# Patient Record
Sex: Male | Born: 1980 | Race: Black or African American | Hispanic: No | Marital: Single | Smoking: Former smoker
Health system: Southern US, Community
[De-identification: ages and names within clinical notes are randomized; demographics above are authoritative.]

---

## 2012-01-29 ENCOUNTER — Ambulatory Visit (INDEPENDENT_AMBULATORY_CARE_PROVIDER_SITE_OTHER): Admitting: Family Medicine

## 2012-01-29 VITALS — BP 120/76 | HR 73 | Temp 97.8°F | Resp 16 | Ht 73.0 in | Wt 272.2 lb

## 2012-01-29 DIAGNOSIS — A088 Other specified intestinal infections: Secondary | ICD-10-CM

## 2012-01-29 DIAGNOSIS — A084 Viral intestinal infection, unspecified: Secondary | ICD-10-CM

## 2012-01-29 NOTE — Progress Notes (Signed)
  [facility]:  Office Visit  Chief Complaint:  Chief Complaint  Patient presents with  . Diarrhea    x yesterday  . Abdominal Pain    HPI: Micheal Stephens is a 31 y.o. male who complains of  2 day h/o nonbloody diarrhea, nausea, vomiting x 5 episodes and diarrhea x 7 episodes, however sxs have stopped. Started after eating banana bread, pizza, beer; girlfriend also has similar sxs. Denies fevers, chills. No recent travels.  Episodes back to back after eating, all liquid. He tool Immodium and that seemed to help. Patient would like a not for work.   History reviewed. No pertinent past medical history. History reviewed. No pertinent past surgical history. History   Social History  . Marital Status: Legally Separated    Spouse Name: N/A    Number of Children: N/A  . Years of Education: N/A   Social History Main Topics  . Smoking status: Former Games developer  . Smokeless tobacco: None  . Alcohol Use: Yes  . Drug Use: No  . Sexually Active: None   Other Topics Concern  . None   Social History Narrative  . None   No family history on file. No Known Allergies Prior to Admission medications   Medication Sig Start Date End Date Taking? Authorizing Provider  loperamide (IMODIUM A-D) 2 MG tablet Take 2 mg by mouth 4 (four) times daily as needed.   Yes Historical Provider, MD     ROS: The patient denies fevers, chills, night sweats, unintentional weight loss, chest pain, palpitations, wheezing, dyspnea on exertion,  dysuria, hematuria, melena, numbness, weakness, or tingling. +nausea, vomiting, abdominal pain, nonbloody diarrhea  All other systems have been reviewed and were otherwise negative with the exception of those mentioned in the HPI and as above.    PHYSICAL EXAM: Filed Vitals:   01/29/12 1822  BP: 120/76  Pulse: 73  Temp: 97.8 F (36.6 C)  Resp: 16   Filed Vitals:   01/29/12 1822  Height: 6\' 1"  (1.854 m)  Weight: 272 lb 3.2 oz (123.469 kg)    Body mass index is 35.91 kg/(m^2).  General: Alert, no acute distress HEENT:  Normocephalic, atraumatic, oropharynx patent.  Cardiovascular:  Regular rate and rhythm, no rubs murmurs or gallops.  No Carotid bruits, radial pulse intact. No pedal edema.  Respiratory: Clear to auscultation bilaterally.  No wheezes, rales, or rhonchi.  No cyanosis, no use of accessory musculature GI: No organomegaly, abdomen is soft and non-tender, positive bowel sounds.  No masses. Skin: No rashes. Neurologic: Facial musculature symmetric. Psychiatric: Patient is appropriate throughout our interaction. Lymphatic: No cervical lymphadenopathy Musculoskeletal: Gait intact.   LABS: No results found for this or any previous visit.   EKG/XRAY:   Primary read interpreted by Dr. Conley Rolls at [facility].   ASSESSMENT/PLAN: Encounter Diagnosis  Name Primary?  . Viral gastroenteritis vs Food Posioning Yes    Patient declined any bloodwork and imaging studies. He would like a work note ( paper note written) I have advised him to push fluids, increase diet as tolerated (BRAT diet), and try no to use Immodium; f/u PRN if worsening sxs.       [name], DO 01/29/2012 6:41 PM

## 2015-10-16 ENCOUNTER — Emergency Department (HOSPITAL_COMMUNITY): Payer: Self-pay

## 2015-10-16 ENCOUNTER — Emergency Department (HOSPITAL_COMMUNITY)
Admission: EM | Admit: 2015-10-16 | Discharge: 2015-10-16 | Disposition: A | Discharge status: Home / Self Care | Payer: Self-pay | Attending: Emergency Medicine | Admitting: Emergency Medicine

## 2015-10-16 DIAGNOSIS — S60221A Contusion of right hand, initial encounter: Secondary | ICD-10-CM | POA: Insufficient documentation

## 2015-10-16 DIAGNOSIS — Y9289 Other specified places as the place of occurrence of the external cause: Secondary | ICD-10-CM | POA: Insufficient documentation

## 2015-10-16 DIAGNOSIS — S61216A Laceration without foreign body of right little finger without damage to nail, initial encounter: Secondary | ICD-10-CM | POA: Insufficient documentation

## 2015-10-16 DIAGNOSIS — Z79899 Other long term (current) drug therapy: Secondary | ICD-10-CM | POA: Insufficient documentation

## 2015-10-16 DIAGNOSIS — S61411A Laceration without foreign body of right hand, initial encounter: Secondary | ICD-10-CM

## 2015-10-16 DIAGNOSIS — W228XXA Striking against or struck by other objects, initial encounter: Secondary | ICD-10-CM | POA: Insufficient documentation

## 2015-10-16 DIAGNOSIS — Y998 Other external cause status: Secondary | ICD-10-CM | POA: Insufficient documentation

## 2015-10-16 DIAGNOSIS — Y9389 Activity, other specified: Secondary | ICD-10-CM | POA: Insufficient documentation

## 2015-10-16 DIAGNOSIS — Z87891 Personal history of nicotine dependence: Secondary | ICD-10-CM | POA: Insufficient documentation

## 2015-10-16 MED ORDER — LIDOCAINE HCL (PF) 1 % IJ SOLN
INTRAMUSCULAR | Status: AC
  Administered 2015-10-16: 04:00:00
  Filled 2015-10-16: qty 5

## 2015-10-16 MED ORDER — LIDOCAINE HCL (PF) 1 % IJ SOLN
5.0000 mL | Freq: Once | INTRAMUSCULAR | Status: AC
  Administered 2015-10-16: 5 mL via INTRADERMAL
  Filled 2015-10-16: qty 5

## 2015-10-16 MED ORDER — IBUPROFEN 800 MG PO TABS: 800.0000 mg | ORAL_TABLET | Freq: Three times a day (TID) | ORAL | Status: DC | PRN

## 2015-10-16 MED ORDER — BACITRACIN ZINC 500 UNIT/GM EX OINT
TOPICAL_OINTMENT | CUTANEOUS | Status: AC
  Administered 2015-10-16: 05:00:00
  Filled 2015-10-16: qty 3.6

## 2015-10-16 MED ORDER — OXYCODONE-ACETAMINOPHEN 5-325 MG PO TABS: 1.0000 | ORAL_TABLET | Freq: Four times a day (QID) | ORAL | Status: DC | PRN

## 2015-10-16 MED ORDER — OXYCODONE-ACETAMINOPHEN 5-325 MG PO TABS
2.0000 | ORAL_TABLET | Freq: Once | ORAL | Status: AC
  Administered 2015-10-16: 2 via ORAL
  Filled 2015-10-16: qty 2

## 2015-10-16 MED ORDER — BACITRACIN ZINC 500 UNIT/GM EX OINT
TOPICAL_OINTMENT | Freq: Once | CUTANEOUS | Status: AC
  Administered 2015-10-16: 1 via TOPICAL
  Filled 2015-10-16: qty 0.9

## 2015-10-16 NOTE — ED Provider Notes (Signed)
TIME SEEN: 3:25 AM  CHIEF COMPLAINT: Right hand and wrist injury  HPI: Pt is a 34 y.o. right-hand-dominant male with no significant past medical history he states that he got mad tonight and punched a door with his right hand. Is complaining of pain over the dorsal fourth and fifth metacarpals and a laceration at the base of the right fifth digit with difficulty flexing and extending his finger secondary to pain. Last tetanus vaccination was in 2011. No other injury. No numbness or focal weakness.  ROS: See HPI Constitutional: no fever  Eyes: no drainage  ENT: no runny nose   Cardiovascular:  no chest pain  Resp: no SOB  GI: no vomiting GU: no dysuria Integumentary: no rash  Allergy: no hives  Musculoskeletal: no leg swelling  Neurological: no slurred speech ROS otherwise negative  PAST MEDICAL HISTORY/PAST SURGICAL HISTORY:  No past medical history on file.  MEDICATIONS:  Prior to Admission medications   Medication Sig Start Date End Date Taking? Authorizing Provider  ibuprofen (ADVIL,MOTRIN) 200 MG tablet Take 200 mg by mouth every 6 (six) hours as needed.   Yes Historical Provider, MD  mirtazapine (REMERON) 15 MG tablet Take 10 mg by mouth at bedtime.   Yes Historical Provider, MD  sertraline (ZOLOFT) 100 MG tablet Take 100 mg by mouth daily.   Yes Historical Provider, MD  loperamide (IMODIUM A-D) 2 MG tablet Take 2 mg by mouth 4 (four) times daily as needed.    Historical Provider, MD    ALLERGIES:  No Known Allergies  SOCIAL HISTORY:  Social History  Substance Use Topics  . Smoking status: Former Games developer  . Smokeless tobacco: Not on file  . Alcohol Use: Yes    FAMILY HISTORY: No family history on file.  EXAM: BP 120/80 mmHg  Pulse 91  Temp(Src) 98.1 F (36.7 C) (Oral)  Ht  (1.905 m)  Wt 310 lb (140.615 kg)  BMI 38.75 kg/m2  SpO2 96% RR 22 CONSTITUTIONAL: Alert and oriented and responds appropriately to questions. Well-appearing; well-nourished HEAD:  Normocephalic EYES: Conjunctivae clear, PERRL ENT: normal nose; no rhinorrhea; moist mucous membranes; pharynx without lesions noted NECK: Supple, no meningismus, no LAD  CARD: RRR; S1 and S2 appreciated; no murmurs, no clicks, no rubs, no gallops RESP: Normal chest excursion without splinting or tachypnea; breath sounds clear and equal bilaterally; no wheezes, no rhonchi, no rales, no hypoxia or respiratory distress, speaking full sentences ABD/GI: Normal bowel sounds; non-distended; soft, non-tender, no rebound, no guarding, no peritoneal signs BACK:  The back appears normal and is non-tender to palpation, there is no CVA tenderness EXT: Patient is tender to palpation over the dorsal right wrist diffusely without obvious scaphoid tenderness or pain with axial loading of the thumb, tender over the fourth and fifth metacarpals on the right side without obvious deformity, patient has a difficult time flexing his fingers secondary to pain but there is no obvious scissoring noted, difficult time completely flexing his fingers also secondary to pain, he appears to have a deep 5 cm laceration to the base of the fifth right finger, no tenderness over the right forearm, elbow or humerus or shoulder; 2+ radial pulses bilaterally, reports normal sensation throughout the right arm, otherwise Normal ROM in all joints; otherwise extremity's are non-tender to palpation; no edema; normal capillary refill; no cyanosis, no calf tenderness or swelling    SKIN: Normal color for age and race; warm NEURO: Moves all extremities equally, sensation to light touch intact diffusely, cranial  nerves II through XII intact PSYCH: The patient's mood and manner are appropriate. Grooming and personal hygiene are appropriate.  MEDICAL DECISION MAKING: Patient here with right hand injury. Complaining of right hand and wrist pain. We'll obtain x-rays. His tetanus is up-to-date. Laceration may involve extensor tendons is difficult to get  a good exam on the patient secondary to his pain. Also concerned this could be an open fracture. Will obtain x-ray prior to laceration repair. We'll give pain medication.  ED PROGRESS: X-ray show no fracture or dislocation. I was able to anesthetize the hand with 1% lidocaine without epinephrine and do a thorough inspection. There is no sign of involvement of any tendons. He has full extension and flexion in these fingers. I was able to repair this wound and will place him in a splint for protection given the laceration is right at the base of the fourth and fifth digits. Discussed wound care instructions and return precautions. We'll discharge with pain medication for his contusions. Have advised him to follow up with his primary care provider in 7 days for suture removal. Patient and significant other at bedside verbalize understanding and are comfortable with this plan.    LACERATION REPAIR Performed by: Raelyn NumberWARD, KRISTEN N Authorized by: Raelyn NumberWARD, KRISTEN N Consent: Verbal consent obtained. Risks and benefits: risks, benefits and alternatives were discussed Consent given by: patient Patient identity confirmed: provided demographic data Prepped and Draped in normal sterile fashion Wound explored  Laceration Location: Right hand  Laceration Length: 5 cm  No Foreign Bodies seen or palpated  Anesthesia: local infiltration  Local anesthetic: lidocaine 1 % without epinephrine  Anesthetic total: 6 ml  Irrigation method: syringe Amount of cleaning: standard  Skin closure: Simple   Number of sutures: 8   Technique: Area anesthetized using lidocaine 1% without epinephrine. Wound irrigated copiously with sterile saline. Wound then cleaned with Betadine and draped in sterile fashion. Wound closed using 8 simple interrupted sutures with 3-0 Prolene.  Bacitracin and sterile dressing applied. Good wound approximation and hemostasis achieved.    Patient tolerance: Patient tolerated the procedure  well with no immediate complications.      Layla MawKristen N Ward, DO 10/16/15 [phone]

## 2015-10-16 NOTE — Discharge Instructions (Signed)
Hand Contusion A hand contusion is a deep bruise on your hand area. Contusions are the result of an injury that caused bleeding under the skin. The contusion may turn blue, purple, or yellow. Minor injuries will give you a painless contusion, but more severe contusions may stay painful and swollen for a few weeks. CAUSES  A contusion is usually caused by a blow, trauma, or direct force to an area of the body. SYMPTOMS   Swelling and redness of the injured area.  Discoloration of the injured area.  Tenderness and soreness of the injured area.  Pain. DIAGNOSIS  The diagnosis can be made by taking a history and performing a physical exam. An X-ray, CT scan, or MRI may be needed to determine if there were any associated injuries, such as broken bones (fractures). TREATMENT  Often, the best treatment for a hand contusion is resting, elevating, icing, and applying cold compresses to the injured area. Over-the-counter medicines may also be recommended for pain control. HOME CARE INSTRUCTIONS   Put ice on the injured area.  Put ice in a plastic bag.  Place a towel between your skin and the bag.  Leave the ice on for 15-20 minutes, 03-04 times a day.  Only take over-the-counter or prescription medicines as directed by your caregiver. Your caregiver may recommend avoiding anti-inflammatory medicines (aspirin, ibuprofen, and naproxen) for 48 hours because these medicines may increase bruising.  If told, use an elastic wrap as directed. This can help reduce swelling. You may remove the wrap for sleeping, showering, and bathing. If your fingers become numb, cold, or blue, take the wrap off and reapply it more loosely.  Elevate your hand with pillows to reduce swelling.  Avoid overusing your hand if it is painful. SEEK IMMEDIATE MEDICAL CARE IF:   You have increased redness, swelling, or pain in your hand.  Your swelling or pain is not relieved with medicines.  You have loss of feeling in  your hand or are unable to move your fingers.  Your hand turns cold or blue.  You have pain when you move your fingers.  Your hand becomes warm to the touch.  Your contusion does not improve in 2 days. MAKE SURE YOU:   Understand these instructions.  Will watch your condition.  Will get help right away if you are not doing well or get worse.   This information is not intended to replace advice given to you by your health care provider. Make sure you discuss any questions you have with your health care provider.   Document Released: 04/29/2002 Document Revised: 08/01/2012 Document Reviewed: 04/30/2012 Elsevier Interactive Patient Education 2016 [employer]  Laceration Care, Adult A laceration is a cut that goes through all of the layers of the skin and into the tissue that is right under the skin. Some lacerations heal on their own. Others need to be closed with stitches (sutures), staples, skin adhesive strips, or skin glue. Proper laceration care minimizes the risk of infection and helps the laceration to heal better. HOW TO CARE FOR YOUR LACERATION If sutures or staples were used:  Keep the wound clean and dry.  If you were given a bandage (dressing), you should change it at least one time per day or as told by your health care provider. You should also change it if it becomes wet or dirty.  Keep the wound completely dry for the first 24 hours or as told by your health care provider. After that time, you may  shower or bathe. However, make sure that the wound is not soaked in water until after the sutures or staples have been removed.  Clean the wound one time each day or as told by your health care provider:  Wash the wound with soap and water.  Rinse the wound with water to remove all soap.  Pat the wound dry with a clean towel. Do not rub the wound.  After cleaning the wound, apply a thin layer of antibiotic ointmentas told by your health care provider. This will help  to prevent infection and keep the dressing from sticking to the wound.  Have the sutures or staples removed as told by your health care provider. If skin adhesive strips were used:  Keep the wound clean and dry.  If you were given a bandage (dressing), you should change it at least one time per day or as told by your health care provider. You should also change it if it becomes dirty or wet.  Do not get the skin adhesive strips wet. You may shower or bathe, but be careful to keep the wound dry.  If the wound gets wet, pat it dry with a clean towel. Do not rub the wound.  Skin adhesive strips fall off on their own. You may trim the strips as the wound heals. Do not remove skin adhesive strips that are still stuck to the wound. They will fall off in time. If skin glue was used:  Try to keep the wound dry, but you may briefly wet it in the shower or bath. Do not soak the wound in water, such as by swimming.  After you have showered or bathed, gently pat the wound dry with a clean towel. Do not rub the wound.  Do not do any activities that will make you sweat heavily until the skin glue has fallen off on its own.  Do not apply liquid, cream, or ointment medicine to the wound while the skin glue is in place. Using those may loosen the film before the wound has healed.  If you were given a bandage (dressing), you should change it at least one time per day or as told by your health care provider. You should also change it if it becomes dirty or wet.  If a dressing is placed over the wound, be careful not to apply tape directly over the skin glue. Doing that may cause the glue to be pulled off before the wound has healed.  Do not pick at the glue. The skin glue usually remains in place for 5-10 days, then it falls off of the skin. General Instructions  Take over-the-counter and prescription medicines only as told by your health care provider.  If you were prescribed an antibiotic medicine or  ointment, take or apply it as told by your doctor. Do not stop using it even if your condition improves.  To help prevent scarring, make sure to cover your wound with sunscreen whenever you are outside after stitches are removed, after adhesive strips are removed, or when glue remains in place and the wound is healed. Make sure to wear a sunscreen of at least 30 SPF.  Do not scratch or pick at the wound.  Keep all follow-up visits as told by your health care provider. This is important.  Check your wound every day for signs of infection. Watch for:  Redness, swelling, or pain.  Fluid, blood, or pus.  Raise (elevate) the injured area above the level of your  heart while you are sitting or lying down, if possible. SEEK MEDICAL CARE IF:  You received a tetanus shot and you have swelling, severe pain, redness, or bleeding at the injection site.  You have a fever.  A wound that was closed breaks open.  You notice a bad smell coming from your wound or your dressing.  You notice something coming out of the wound, such as wood or glass.  Your pain is not controlled with medicine.  You have increased redness, swelling, or pain at the site of your wound.  You have fluid, blood, or pus coming from your wound.  You notice a change in the color of your skin near your wound.  You need to change the dressing frequently due to fluid, blood, or pus draining from the wound.  You develop a new rash.  You develop numbness around the wound. SEEK IMMEDIATE MEDICAL CARE IF:  You develop severe swelling around the wound.  Your pain suddenly increases and is severe.  You develop painful lumps near the wound or on skin that is anywhere on your body.  You have a red streak going away from your wound.  The wound is on your hand or foot and you cannot properly move a finger or toe.  The wound is on your hand or foot and you notice that your fingers or toes look pale or bluish.   This  information is not intended to replace advice given to you by your health care provider. Make sure you discuss any questions you have with your health care provider.   Document Released: 11/07/2005 Document Revised: 03/24/2015 Document Reviewed: 11/03/2014 Elsevier Interactive Patient Education 2016 Elsevier Inc[employer] RICE for Routine Care of Injuries Theroutine careofmanyinjuriesincludes rest, ice, compression, and elevation (RICE therapy). RICE therapy is often recommended for injuries to soft tissues, such as a muscle strain, ligament injuries, bruises, and overuse injuries. It can also be used for some bony injuries. Using RICE therapy can help to relieve pain, lessen swelling, and enable your body to heal. Rest Rest is required to allow your body to heal. This usually involves reducing your normal activities and avoiding use of the injured part of your body. Generally, you can return to your normal activities when you are comfortable and have been given permission by your health care provider. Ice Icing your injury helps to keep the swelling down, and it lessens pain. Do not apply ice directly to your skin.  Put ice in a plastic bag.  Place a towel between your skin and the bag.  Leave the ice on for 20 minutes, 2-3 times a day. Do this for as long as you are directed by your health care provider. Compression Compression means putting pressure on the injured area. Compression helps to keep swelling down, gives support, and helps with discomfort. Compression may be done with an elastic bandage. If an elastic bandage has been applied, follow these general tips:  Remove and reapply the bandage every 3-4 hours or as directed by your health care provider.  Make sure the bandage is not wrapped too tightly, because this can cut off circulation. If part of your body beyond the bandage becomes blue, numb, cold, swollen, or more painful, your bandage is most likely too tight. If this occurs, remove  your bandage and reapply it more loosely.  See your health care provider if the bandage seems to be making your problems worse rather than better. Elevation Elevation means keeping the injured area raised. This  helps to lessen swelling and decrease pain. If possible, your injured area should be elevated at or above the level of your heart or the center of your chest. WHEN SHOULD I SEEK MEDICAL CARE? You should seek medical care if:  Your pain and swelling continue.  Your symptoms are getting worse rather than improving. These symptoms may indicate that further evaluation or further X-rays are needed. Sometimes, X-rays may not show a small broken bone (fracture) until a number of days later. Make a follow-up appointment with your health care provider. WHEN SHOULD I SEEK IMMEDIATE MEDICAL CARE? You should seek immediate medical care if:  You have sudden severe pain at or below the area of your injury.  You have redness or increased swelling around your injury.  You have tingling or numbness at or below the area of your injury that does not improve after you remove the elastic bandage.   This information is not intended to replace advice given to you by your health care provider. Make sure you discuss any questions you have with your health care provider.   Document Released: 02/19/2001 Document Revised: 07/29/2015 Document Reviewed: 10/15/2014 Elsevier Interactive Patient Education [employer]

## 2015-10-16 NOTE — ED Notes (Signed)
Pt admits to striking a wall with his right hand.  Pt has a wound to the top of his right hand with a dressing in place by ems. Bleeding controlled at this time

## 2016-10-27 IMAGING — DX DG HAND COMPLETE 3+V*R*
3 series · 3 of 3 positions shown · non-contrast
Comparison: None.

CLINICAL DATA: Hit wall with right hand, with dorsal right hand
wound. Initial encounter.

EXAM:
RIGHT HAND - COMPLETE 3+ VIEW

[hand pa]
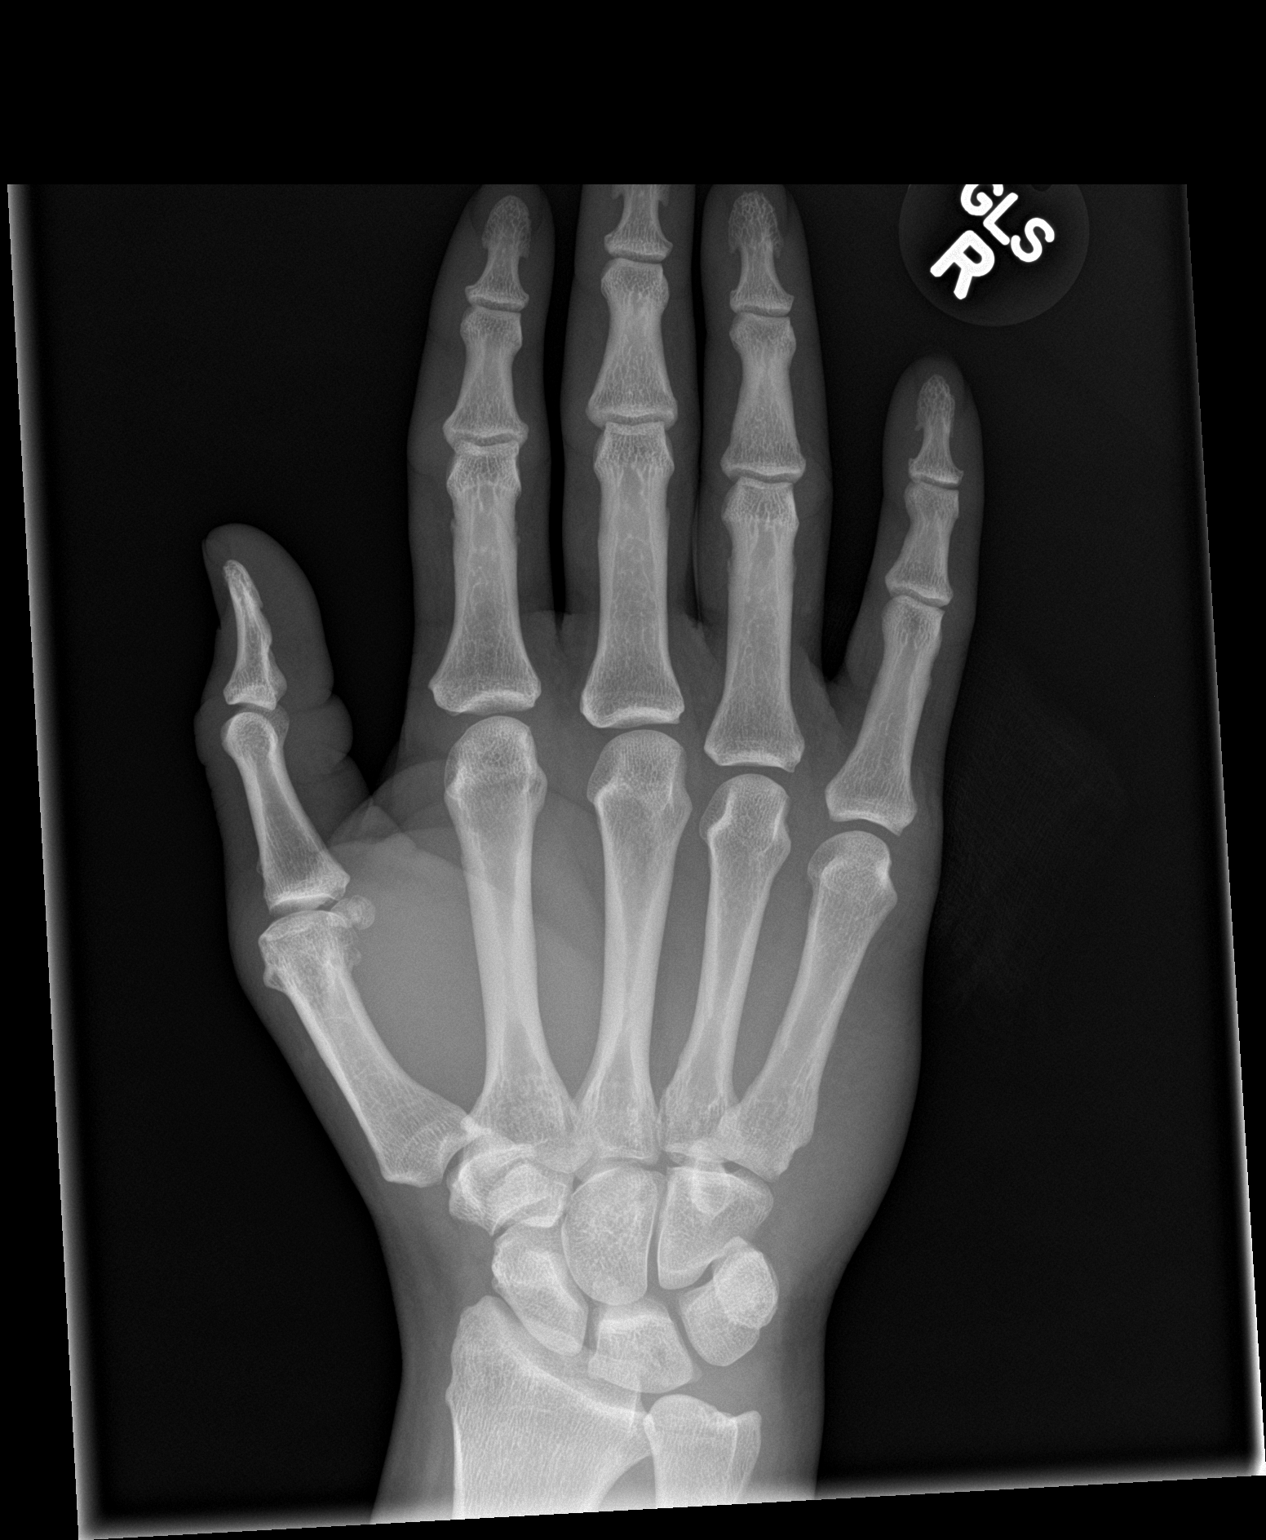

[hand obl]
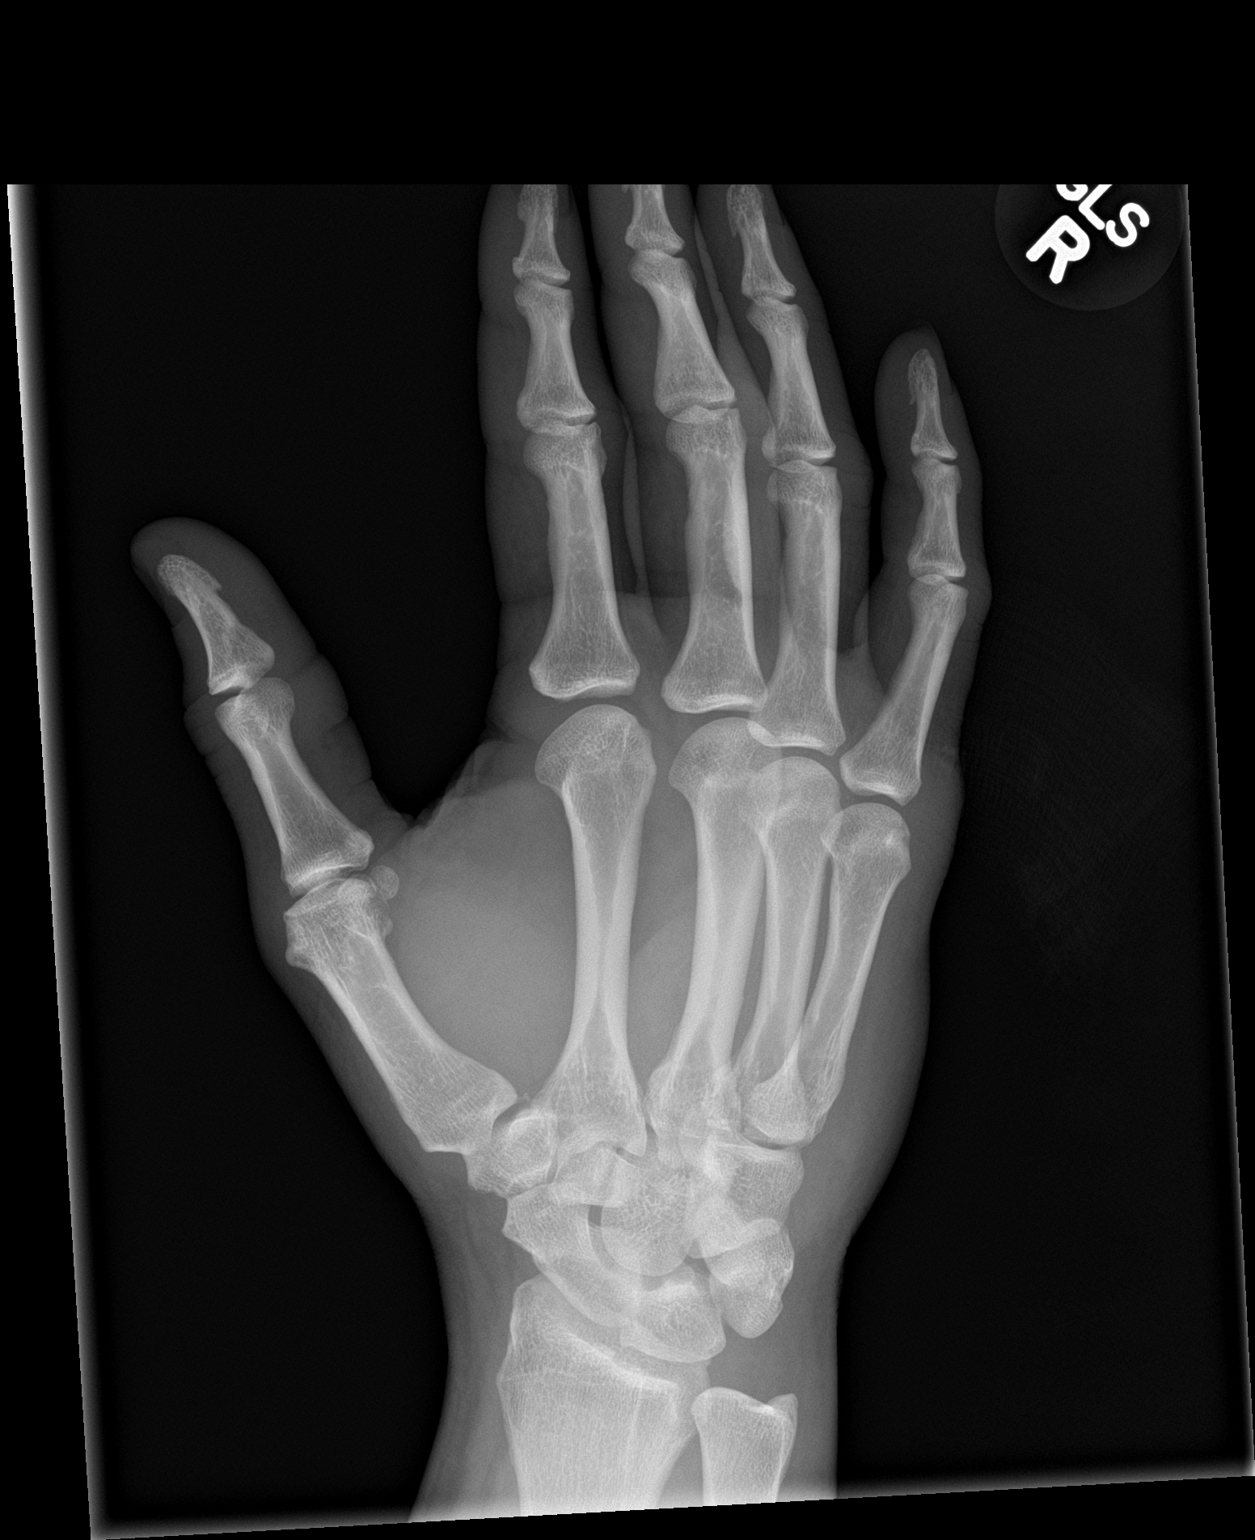

[hand lat]
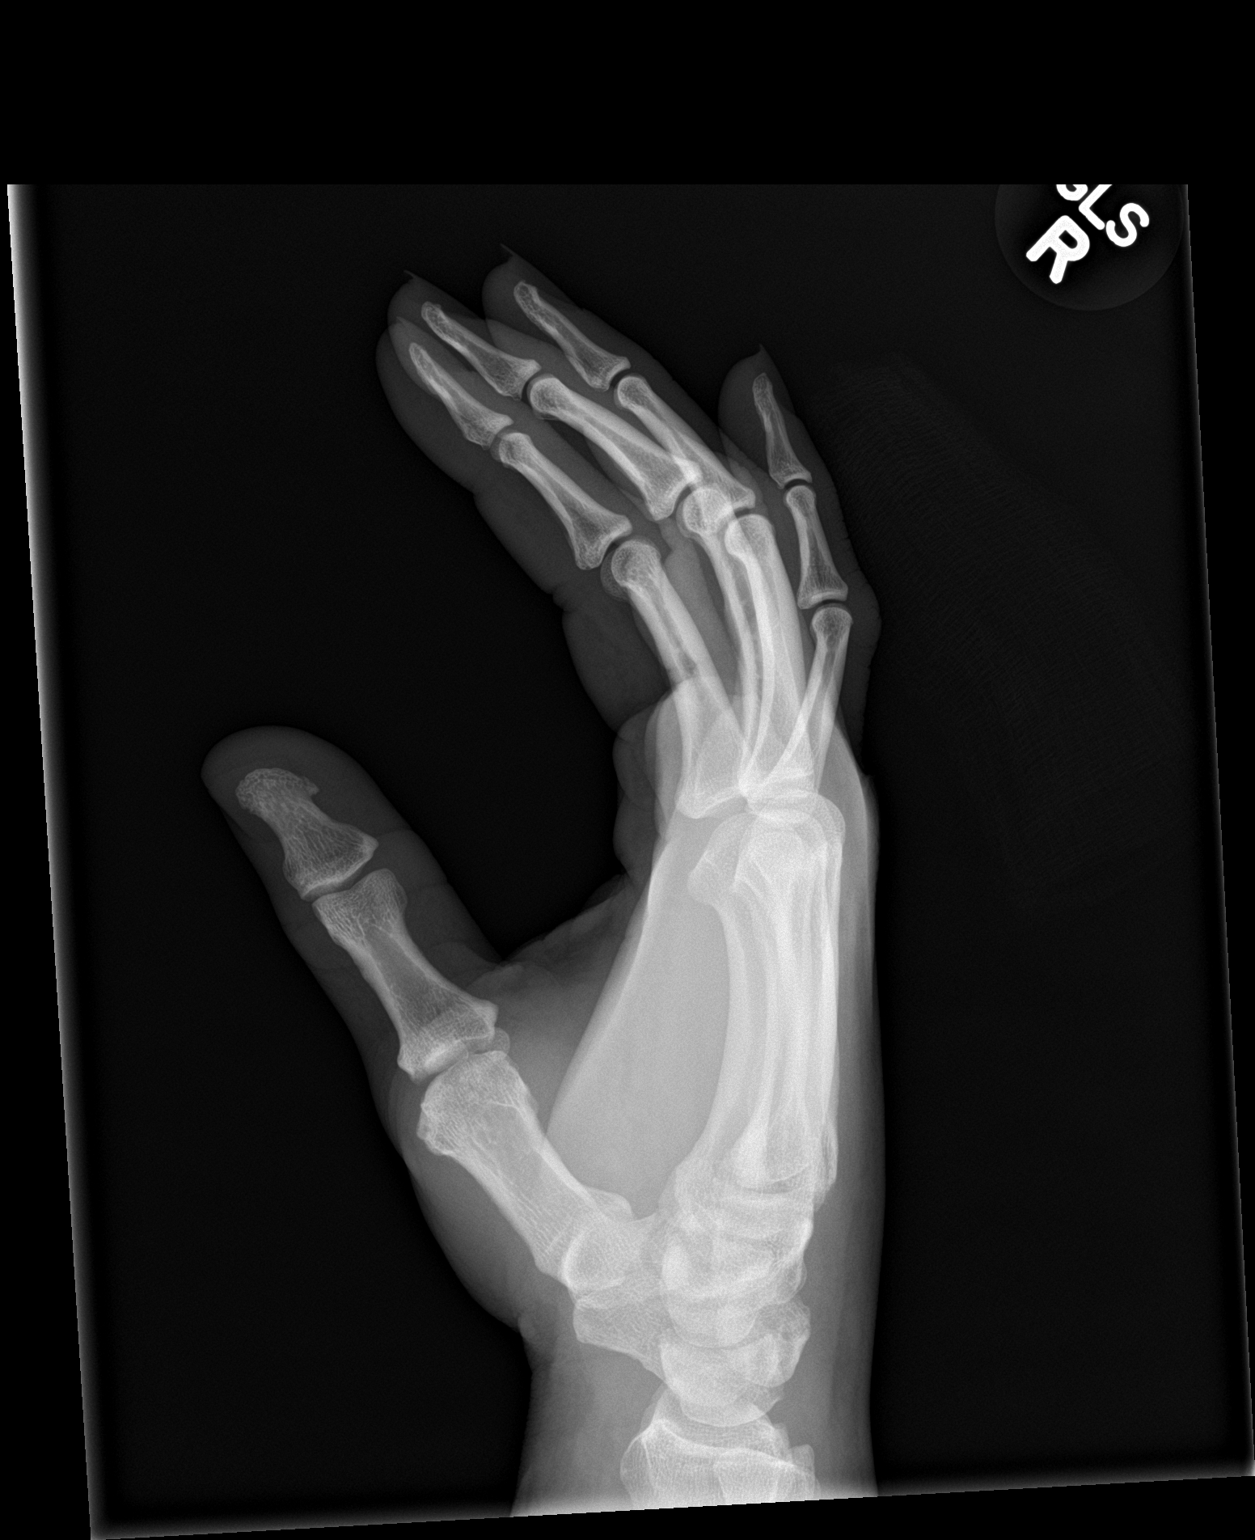

[3 of 3 positions shown; findings below may reference images not displayed]

FINDINGS: There is no evidence of fracture or dislocation. The joint spaces
are preserved. The carpal rows are intact, and demonstrate normal
alignment. Mild dorsal soft tissue swelling is noted. No radiopaque
foreign bodies are seen.
IMPRESSION: No evidence of fracture or dislocation.

## 2017-01-11 ENCOUNTER — Telehealth: Payer: Self-pay

## 2017-01-11 NOTE — Telephone Encounter (Signed)
Error

## 2017-11-06 ENCOUNTER — Emergency Department (HOSPITAL_COMMUNITY)
Admission: EM | Admit: 2017-11-06 | Discharge: 2017-11-06 | Disposition: A | Discharge status: Home / Self Care | Payer: Self-pay | Attending: Emergency Medicine | Admitting: Emergency Medicine

## 2017-11-06 DIAGNOSIS — Z202 Contact with and (suspected) exposure to infections with a predominantly sexual mode of transmission: Secondary | ICD-10-CM | POA: Insufficient documentation

## 2017-11-06 DIAGNOSIS — Z5321 Procedure and treatment not carried out due to patient leaving prior to being seen by health care provider: Secondary | ICD-10-CM | POA: Insufficient documentation

## 2017-11-07 ENCOUNTER — Encounter: Payer: Self-pay | Admitting: Emergency Medicine

## 2017-11-07 ENCOUNTER — Other Ambulatory Visit: Payer: Self-pay

## 2017-11-07 ENCOUNTER — Emergency Department
Admission: EM | Admit: 2017-11-07 | Discharge: 2017-11-07 | Disposition: A | Discharge status: Home / Self Care | Payer: Self-pay | Attending: Emergency Medicine | Admitting: Emergency Medicine

## 2017-11-07 DIAGNOSIS — Z87891 Personal history of nicotine dependence: Secondary | ICD-10-CM | POA: Insufficient documentation

## 2017-11-07 DIAGNOSIS — A549 Gonococcal infection, unspecified: Secondary | ICD-10-CM | POA: Insufficient documentation

## 2017-11-07 DIAGNOSIS — Z79899 Other long term (current) drug therapy: Secondary | ICD-10-CM | POA: Insufficient documentation

## 2017-11-07 DIAGNOSIS — Z79891 Long term (current) use of opiate analgesic: Secondary | ICD-10-CM | POA: Insufficient documentation

## 2017-11-07 LAB — URINALYSIS, COMPLETE (UACMP) WITH MICROSCOPIC
Bacteria, UA: NONE SEEN
Bilirubin Urine: NEGATIVE
GLUCOSE, UA: NEGATIVE mg/dL
Ketones, ur: NEGATIVE mg/dL
Nitrite: NEGATIVE
PH: 6 (ref 5.0–8.0)
Protein, ur: 30 mg/dL — AB
SPECIFIC GRAVITY, URINE: 1.023 (ref 1.005–1.030)
SQUAMOUS EPITHELIAL / LPF: NONE SEEN

## 2017-11-07 LAB — CHLAMYDIA/NGC RT PCR (ARMC ONLY)
Chlamydia Tr: NOT DETECTED
N GONORRHOEAE: DETECTED — AB

## 2017-11-07 MED ORDER — AZITHROMYCIN 1 G PO PACK
1.0000 g | PACK | Freq: Once | ORAL | Status: AC
  Administered 2017-11-07: 1 g via ORAL
  Filled 2017-11-07: qty 1

## 2017-11-07 MED ORDER — CEFTRIAXONE SODIUM 250 MG IJ SOLR
250.0000 mg | Freq: Once | INTRAMUSCULAR | Status: AC
  Administered 2017-11-07: 250 mg via INTRAMUSCULAR
  Filled 2017-11-07: qty 250

## 2017-11-07 NOTE — ED Triage Notes (Signed)
Pt presents to ED with penile discharge since yesterday am. Pt states he also has painful urination and he has noticed that he has blood in his urine. Reports "pain in that general area". Pt state she also had eye drainage when he first woke up. [facility] but they had too long of a wait and he decided not to stay.

## 2017-11-07 NOTE — ED Provider Notes (Signed)
[facility] Emergency Department Provider Note   ____________________________________________   First MD Initiated Contact with Patient 11/07/17 0422     (approximate)  I have reviewed the triage vital signs and the nursing notes.   HISTORY  Chief Complaint Penile Discharge; Eye Drainage; and Hematuria    HPI Micheal Stephens is a 36 y.o. male who comes into the hospital today with some drainage pain and irritation at his penis.  The patient states that he also has a foul odor.  He reports that it started yesterday.  The drainage was initially clear but is now yellow.  He reports that is been getting worse over time.  The patient states that he usually uses protection when he is sexually active but 2 days ago he had unprotected sex.  He states he also woke up with some right eye drainage yesterday as well.  He states that his pain is a 5 out of 10 in intensity.  The patient was concerned so he decided to come into the hospital for evaluation.  History reviewed. No pertinent past medical history.  There are no active problems to display for this patient.   History reviewed. No pertinent surgical history.  Prior to Admission medications   Medication Sig Start Date End Date Taking? Authorizing Provider  ibuprofen (ADVIL,MOTRIN) 800 MG tablet Take 1 tablet (800 mg total) by mouth every 8 (eight) hours as needed for mild pain. 10/16/15   Ward, Layla MawKristen N, DO  loperamide (IMODIUM A-D) 2 MG tablet Take 2 mg by mouth 4 (four) times daily as needed.    [provider]  mirtazapine (REMERON) 15 MG tablet Take 10 mg by mouth at bedtime.    [provider]  oxyCODONE-acetaminophen (PERCOCET/ROXICET) 5-325 MG tablet Take 1-2 tablets by mouth every 6 (six) hours as needed. 10/16/15   Ward, Layla MawKristen N, DO  sertraline (ZOLOFT) 100 MG tablet Take 100 mg by mouth daily.    [provider]    Allergies Patient has no known allergies.  History  reviewed. No pertinent family history.  Social History Social History   Tobacco Use  . Smoking status: Former Games developermoker  . Smokeless tobacco: Never Used  Substance Use Topics  . Alcohol use: Yes  . Drug use: Yes    Types: Marijuana    Review of Systems  Constitutional: No fever/chills Eyes: No visual changes. ENT: No sore throat. Cardiovascular: Denies chest pain. Respiratory: Denies shortness of breath. Gastrointestinal: No abdominal pain.  No nausea, no vomiting.  No diarrhea.  No constipation. Genitourinary: Dysuria, penile discharge. Musculoskeletal: Negative for back pain. Skin: Negative for rash. Neurological: Negative for headaches, focal weakness or numbness.   ____________________________________________   PHYSICAL EXAM:  VITAL SIGNS: ED Triage Vitals  Enc Vitals Group     BP 11/07/17 0020 (!) 144/97     Pulse Rate 11/07/17 0020 85     Resp 11/07/17 0020 18     Temp 11/07/17 0020 98.2 F (36.8 C)     Temp Source 11/07/17 0020 Oral     SpO2 11/07/17 0020 98 %     Weight 11/07/17 0020 275 lb (124.7 kg)     Height 11/07/17 0020 6\' 2"  (1.88 m)     Head Circumference --      Peak Flow --      Pain Score 11/07/17 0019 7     Pain Loc --      Pain Edu? --      Excl. in  GC? --     Constitutional: Alert and oriented. Well appearing and in mild distress. Eyes: Conjunctivae are normal. PERRL. EOMI. Head: Atraumatic. Nose: No congestion/rhinnorhea. Mouth/Throat: Mucous membranes are moist.  Oropharynx non-erythematous. Cardiovascular: Normal rate, regular rhythm. Grossly normal heart sounds.  Good peripheral circulation. Respiratory: Normal respiratory effort.  No retractions. Lungs CTAB. Gastrointestinal: Soft and nontender. No distention.  Genitourinary: Yellow appearing penile discharge with some inguinal lymphadenopathy. Musculoskeletal: No lower extremity tenderness nor edema.   Neurologic:  Normal speech and language.  Skin:  Skin is warm, dry and  intact. Marland Kitchen. Psychiatric: Mood and affect are normal. .  ____________________________________________   LABS (all labs ordered are listed, but only abnormal results are displayed)  Labs Reviewed  CHLAMYDIA/NGC RT PCR ([facility] ONLY) - Abnormal; Notable for the following components:      Result Value   N gonorrhoeae DETECTED (*)    All other components within normal limits  URINALYSIS, COMPLETE (UACMP) WITH MICROSCOPIC - Abnormal; Notable for the following components:   Color, Urine YELLOW (*)    APPearance HAZY (*)    Hgb urine dipstick SMALL (*)    Protein, ur 30 (*)    Leukocytes, UA MODERATE (*)    All other components within normal limits   ____________________________________________  EKG  none ____________________________________________  RADIOLOGY  No results found.  ____________________________________________   PROCEDURES  Procedure(s) performed: None  Procedures  Critical Care performed: No  ____________________________________________   INITIAL IMPRESSION / ASSESSMENT AND PLAN / ED COURSE  As part of my medical decision making, I reviewed the following data within the electronic MEDICAL RECORD NUMBER Notes from prior ED visits and [place] Controlled Substance Database   This is a 36 year old male who comes into the hospital today with some penile discharge pain with urination.  Differential diagnosis includes urinary tract infection, urethritis, STD.  We did receive the results of the patient's urinalysis and it showed too numerous to count white blood cells as well as a positive gonorrhea.  I did give the patient a shot of ceftriaxone and azithromycin.  I do not see any active drainage from his right eye nor any significantly injected conjunctiva but the treatment is the same.  The patient will be discharged home to follow-up with his primary care physician or the acute care clinic.      ____________________________________________   FINAL CLINICAL  IMPRESSION(S) / ED DIAGNOSES  Final diagnoses:  Gonorrhea     ED Discharge Orders    None       Note:  This document was prepared using Dragon voice recognition software and may include unintentional dictation errors.    Rebecka ApleyWebster, Kelwin Gibler P, MD 11/07/17 [phone]

## 2017-11-07 NOTE — Discharge Instructions (Signed)
Please follow up with your primary care physician for further evaluation of your primary care physician.

## 2020-04-24 DIAGNOSIS — L729 Follicular cyst of the skin and subcutaneous tissue, unspecified: Secondary | ICD-10-CM | POA: Insufficient documentation

## 2020-10-08 ENCOUNTER — Encounter: Payer: Self-pay | Admitting: Podiatry

## 2020-10-08 ENCOUNTER — Other Ambulatory Visit: Payer: Self-pay

## 2020-10-08 ENCOUNTER — Ambulatory Visit (INDEPENDENT_AMBULATORY_CARE_PROVIDER_SITE_OTHER): Payer: BC Managed Care – PPO | Admitting: Podiatry

## 2020-10-08 DIAGNOSIS — L603 Nail dystrophy: Secondary | ICD-10-CM

## 2020-10-08 NOTE — Progress Notes (Signed)
  Subjective:  Patient ID: Micheal Stephens, male    DOB: March 13, 1981,  MRN: [id number] HPI Chief Complaint  Patient presents with  . Nail Problem    Toenails - thick and discolored x years, think developed in the [employer], wore wet boots a lot, took lamisil rx'd here years ago, helped but came back  . Skin Problem    Skin - thick, callused skin x years, used to get regular pedicures-no help, coco butter daily-no help  . New Patient (Initial Visit)    39 y.o. male presents with the above complaint.   ROS: Denies fever chills nausea vomiting muscle aches pains calf pain back pain chest pain shortness of breath.  No past medical history on file. No past surgical history on file. No current outpatient medications on file.  No Known Allergies Review of Systems Objective:  There were no vitals filed for this visit.  General: Well developed, nourished, in no acute distress, alert and oriented x3   Dermatological: Skin is warm, dry and supple bilateral. Nails x 10 are poorly maintained and clinically dystrophic; remaining integument appears demonstrate tinea pedis and reactive hyperkeratoses.  At this time. There are no open sores, no preulcerative lesions, no rash or signs of infection present.  Vascular: Dorsalis Pedis artery and Posterior Tibial artery pedal pulses are 2/4 bilateral with immedate capillary fill time. Pedal hair growth present. No varicosities and no lower extremity edema present bilateral.   Neruologic: Grossly intact via light touch bilateral. Vibratory intact via tuning fork bilateral. Protective threshold with Semmes Wienstein monofilament intact to all pedal sites bilateral. Patellar and Achilles deep tendon reflexes 2+ bilateral. No Babinski or clonus noted bilateral.   Musculoskeletal: No gross boney pedal deformities bilateral. No pain, crepitus, or limitation noted with foot and ankle range of motion bilateral. Muscular strength 5/5 in all groups tested  bilateral.  Gait: Unassisted, Nonantalgic.    Radiographs:  None taken  Assessment & Plan:   Assessment: Nail dystrophy probable onychomycosis and tinea pedis.  Plan: Samples of the skin and nail were taken today to be sent for pathologic evaluation we will follow-up with him in 1 month     Micheal Stephens T. [place], [place]

## 2020-11-05 ENCOUNTER — Ambulatory Visit: Payer: BC Managed Care – PPO | Admitting: Podiatry
